# Patient Record
Sex: Female | Born: 1965 | Race: White | Hispanic: No | Marital: Single | Smoking: Current every day smoker
Health system: Southern US, Community
[De-identification: ages and names within clinical notes are randomized; demographics above are authoritative.]

## PROBLEM LIST (undated history)

## (undated) HISTORY — PX: ABDOMINAL HYSTERECTOMY: SHX81

---

## 2004-06-22 ENCOUNTER — Emergency Department: Payer: Self-pay | Admitting: Internal Medicine

## 2015-03-16 ENCOUNTER — Ambulatory Visit
Admission: RE | Admit: 2015-03-16 | Discharge: 2015-03-16 | Disposition: A | Discharge status: Home / Self Care | Payer: Self-pay | Source: Ambulatory Visit | Attending: Oncology | Admitting: Oncology

## 2015-03-16 ENCOUNTER — Ambulatory Visit: Payer: Self-pay | Attending: Oncology

## 2015-03-16 ENCOUNTER — Other Ambulatory Visit: Payer: Self-pay

## 2015-03-16 VITALS — BP 127/84 | HR 84 | Temp 97.9°F | Resp 18 | Ht 64.96 in | Wt 252.9 lb

## 2015-03-16 DIAGNOSIS — Z0000 Encounter for general adult medical examination without abnormal findings: Secondary | ICD-10-CM

## 2015-03-16 NOTE — Progress Notes (Signed)
Subjective:     Patient ID: Jane Cruz, female   DOB: 1965-07-20, 50 y.o.   MRN: [id number]  HPI   Review of Systems     Objective:   Physical Exam  Pulmonary/Chest: Right breast exhibits no inverted nipple, no mass, no nipple discharge, no skin change and no tenderness. Left breast exhibits no inverted nipple, no mass, no nipple discharge, no skin change and no tenderness. Breasts are symmetrical.       Assessment:     50 year old patient presents for [facility] clinic visit.  Patient screened, and meets BCCCP eligibility.  Patient does not have insurance, Medicare or Medicaid.  Handout given on Affordable Care Act.  Patient concerned over white discharge from montgomery gland right breast which she has been manipulating.  CBE unremarkable. Bilateral symmetrical fibroglandular tissue.  Instructed patient on breast self-exam using teach back method.       Plan:     Sent for bilateral screening mammogram.

## 2015-03-18 ENCOUNTER — Other Ambulatory Visit: Payer: Self-pay | Admitting: *Deleted

## 2015-03-18 DIAGNOSIS — N63 Unspecified lump in unspecified breast: Secondary | ICD-10-CM

## 2015-04-08 ENCOUNTER — Ambulatory Visit
Admission: RE | Admit: 2015-04-08 | Discharge: 2015-04-08 | Disposition: A | Discharge status: Home / Self Care | Payer: Self-pay | Source: Ambulatory Visit | Attending: Oncology | Admitting: Oncology

## 2015-04-08 DIAGNOSIS — N63 Unspecified lump in unspecified breast: Secondary | ICD-10-CM

## 2015-04-12 NOTE — Progress Notes (Signed)
Letter mailed from [facility] to notify of normal mammogram results.  Patient to return in one year for annual screening.  Copy to [facility]. 

## 2017-04-03 ENCOUNTER — Encounter: Payer: Self-pay | Admitting: Emergency Medicine

## 2017-04-03 ENCOUNTER — Ambulatory Visit
Admission: EM | Admit: 2017-04-03 | Discharge: 2017-04-03 | Disposition: A | Discharge status: Home / Self Care | Payer: Self-pay | Attending: Family Medicine | Admitting: Family Medicine

## 2017-04-03 ENCOUNTER — Other Ambulatory Visit: Payer: Self-pay

## 2017-04-03 DIAGNOSIS — H60502 Unspecified acute noninfective otitis externa, left ear: Secondary | ICD-10-CM

## 2017-04-03 MED ORDER — NEOMYCIN-POLYMYXIN-HC 3.5-10000-1 OT SUSP: 4.0000 [drp] | Freq: Three times a day (TID) | OTIC | 0 refills | Status: AC

## 2017-04-03 MED ORDER — CIPROFLOXACIN-DEXAMETHASONE 0.3-0.1 % OT SUSP: 4.0000 [drp] | Freq: Two times a day (BID) | OTIC | 0 refills | Status: AC

## 2017-04-03 NOTE — ED Provider Notes (Signed)
[facility]  CSN: [id number] Arrival date & time: 04/03/17  1305  History   Chief Complaint Chief Complaint  Patient presents with  . Otalgia   HPI  52 year old female presents with left ear pain.  Patient states that she is recently been sick.  She states that she has had a respiratory illness for the past 3 weeks.  She been using over-the-counter medication with improvement.  Approximately 2 days ago she developed severe left ear pain.  She has been taking leftover Keflex without resolution.  She is also used her daughter's old prescription for an ear drop without resolution.  She reports drainage from her left ear.  Pain is severe, 7/10 in severity.  No known exacerbating relieving factors.  No other reported symptoms.  No other complaints.  PMH:  No significant PMH.  Past Surgical History:  Procedure Laterality Date  . ABDOMINAL HYSTERECTOMY      OB History    No data available       Home Medications    Prior to Admission medications   Medication Sig Start Date End Date Taking? Authorizing Provider  ciprofloxacin-dexamethasone (CIPRODEX) OTIC suspension Place 4 drops into the left ear 2 (two) times daily for 5 days. 04/03/17 04/08/17  Tommie Samsook, Beaux Wedemeyer G, DO  ibuprofen (ADVIL,MOTRIN) 200 MG tablet Take 200 mg by mouth every 6 (six) hours as needed for headache or mild pain.    [provider]  neomycin-polymyxin-hydrocortisone (CORTISPORIN) 3.5-10000-1 OTIC suspension Place 4 drops into the left ear 3 (three) times daily for 5 days. 04/03/17 04/08/17  Tommie Samsook, Amita Atayde G, DO    Family History No reported family hx.  Social History Social History   Tobacco Use  . Smoking status: Current Every Day Smoker    Types: Cigarettes  . Smokeless tobacco: Never Used  Substance Use Topics  . Alcohol use: Yes    Frequency: Never  . Drug use: No    Allergies   Patient has no known allergies.   Review of Systems Review of Systems  Constitutional: Negative.     HENT: Positive for ear discharge and ear pain.    Physical Exam Triage Vital Signs ED Triage Vitals  Enc Vitals Group     BP 04/03/17 1411 125/84     Pulse Rate 04/03/17 1411 65     Resp 04/03/17 1411 16     Temp 04/03/17 1411 98.3 F (36.8 C)     Temp Source 04/03/17 1411 Oral     SpO2 04/03/17 1411 98 %     Weight 04/03/17 1409 240 lb (108.9 kg)     Height 04/03/17 1409 5\' 5"  (1.651 m)     Head Circumference --      Peak Flow --      Pain Score 04/03/17 1409 7     Pain Loc --      Pain Edu? --      Excl. in GC? --    Updated Vital Signs BP 125/84 (BP Location: Left Arm)   Pulse 65   Temp 98.3 F (36.8 C) (Oral)   Resp 16   Ht 5\' 5"  (1.651 m)   Wt 240 lb (108.9 kg)   SpO2 98%   BMI 39.94 kg/m     Physical Exam  Constitutional: She is oriented to person, place, and time. She appears well-developed. No distress.  HENT:  Head: Normocephalic and atraumatic.  Left ear with tragal tenderness.  Could not visualize TM due to edema the canal.  Drainage noted.  Cardiovascular: Normal rate and regular rhythm.  Pulmonary/Chest: Effort normal and breath sounds normal. She has no wheezes. She has no rales.  Neurological: She is alert and oriented to person, place, and time.  Psychiatric: She has a normal mood and affect. Her behavior is normal.  Nursing note and vitals reviewed.  UC Treatments / Results  Labs (all labs ordered are listed, but only abnormal results are displayed) Labs Reviewed - No data to display  EKG  EKG Interpretation None       Radiology No results found.  Procedures Procedures (including critical care time)  Medications Ordered in UC Medications - No data to display   Initial Impression / Assessment and Plan / UC Course  I have reviewed the triage vital signs and the nursing notes.  Pertinent labs & imaging results that were available during my care of the patient were reviewed by me and considered in my medical decision making (see  chart for details).     52 year old female presents with otitis externa. Treating with drops (patient to see which one she can afford).  Final Clinical Impressions(s) / UC Diagnoses   Final diagnoses:  Acute otitis externa of left ear, unspecified type    ED Discharge Orders        Ordered    neomycin-polymyxin-hydrocortisone (CORTISPORIN) 3.5-10000-1 OTIC suspension  3 times daily     04/03/17 1504    ciprofloxacin-dexamethasone (CIPRODEX) OTIC suspension  2 times daily     04/03/17 1504     Controlled Substance Prescriptions [place] Controlled Substance Registry consulted? Not Applicable   Tommie Sams, DO 04/03/17 1527

## 2017-04-03 NOTE — Discharge Instructions (Signed)
Use one of the antibiotics (depending on affordability).  Take care  Dr. Adriana Simasook

## 2017-04-03 NOTE — ED Triage Notes (Signed)
Patient has swelling and pain in and around her left ear that started 2 days ago.

## 2017-04-10 IMAGING — US US BREAST LTD UNI LEFT INC AXILLA
1 series · 13 of 25 positions shown · non-contrast
Comparison: Baseline screening mammogram dated 03/16/2015.

ACR Breast Density Category a: The breast tissue is almost entirely
fatty.

CLINICAL DATA: Patient returns today to evaluate left breast masses
identified on patient's recent baseline screening mammogram.

EXAM:
DIGITAL DIAGNOSTIC LEFT MAMMOGRAM WITH 3D TOMOSYNTHESIS
ULTRASOUND LEFT BREAST

[Series 1: us breast ltd uni left inc axilla · 0.05mm/px · 13 of 29 slices shown]
[im 1/29]
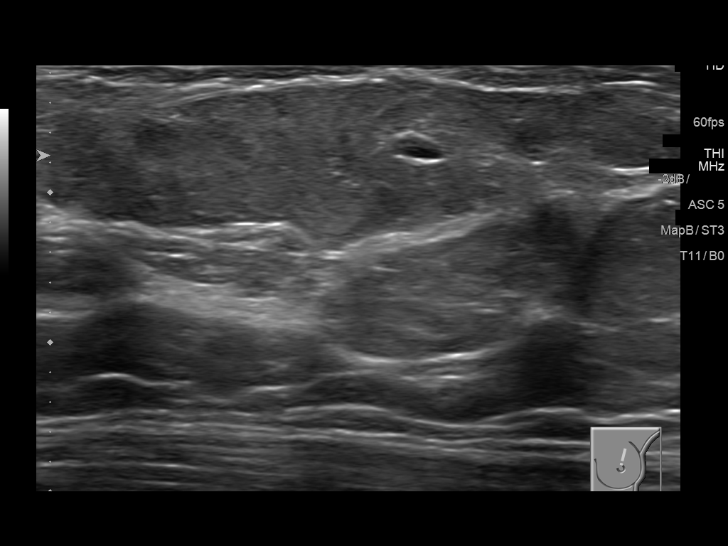
[im 3/29]
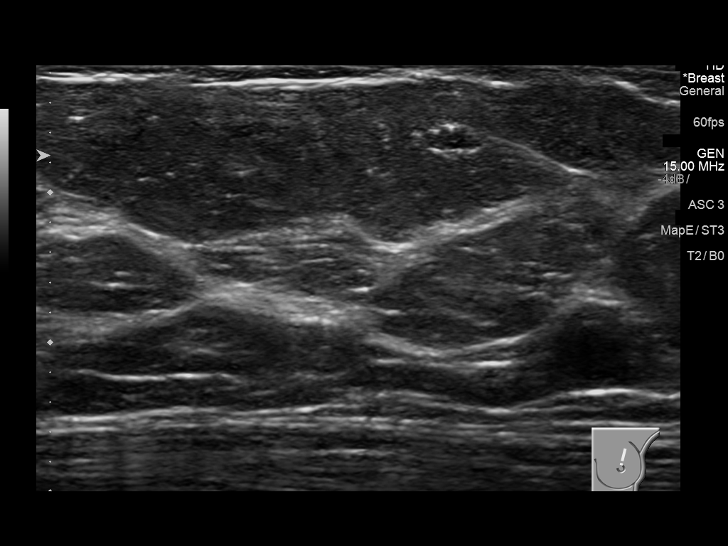
[im 5/29]
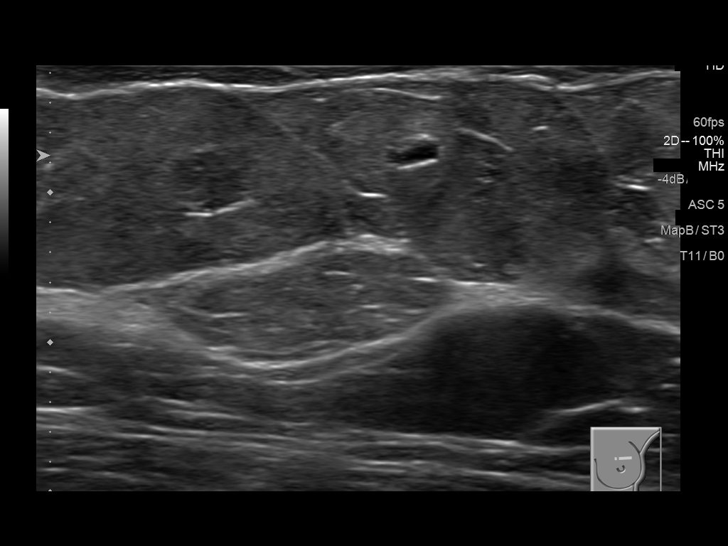
[im 8/29]
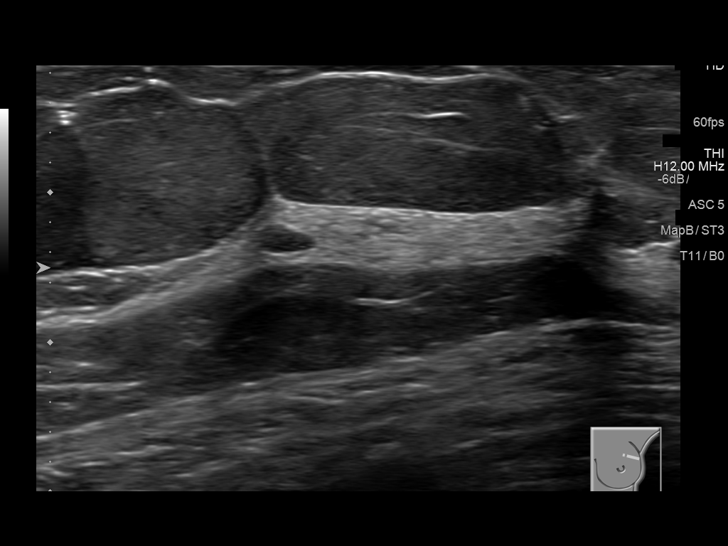
[im 10/29]
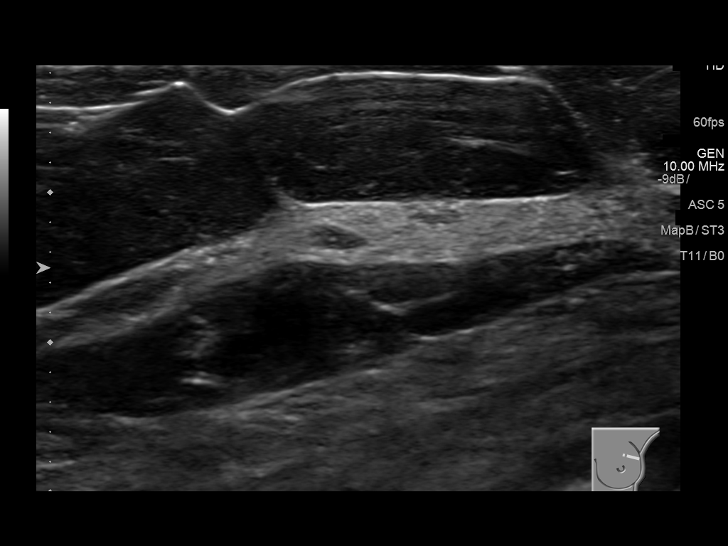
[im 12/29]
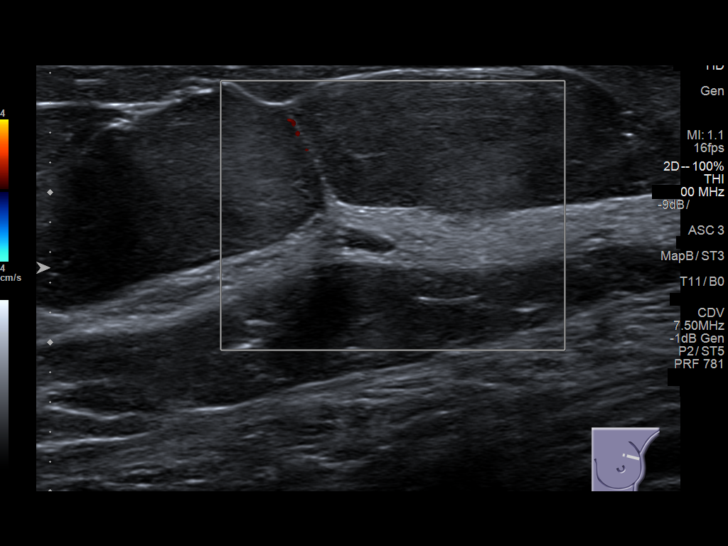
[im 15/29]
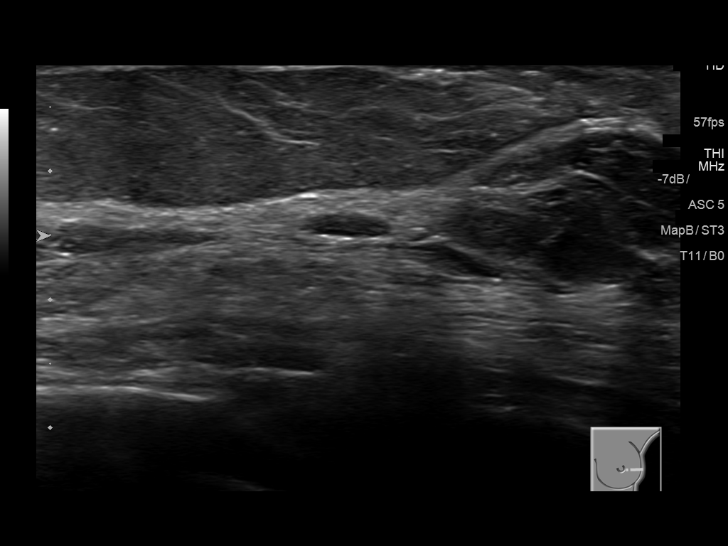
[im 17/29]
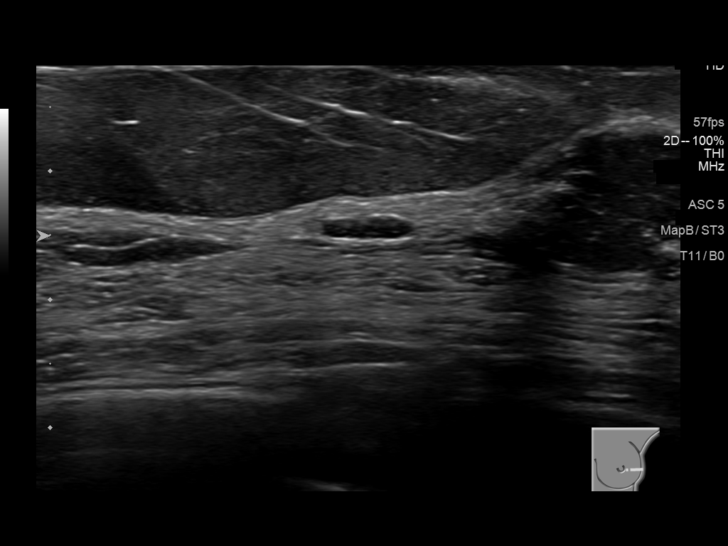
[im 19/29]
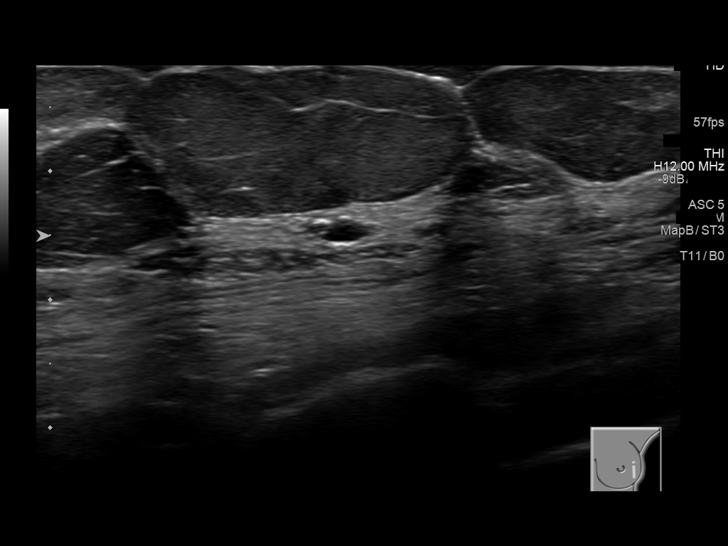
[im 22/29]
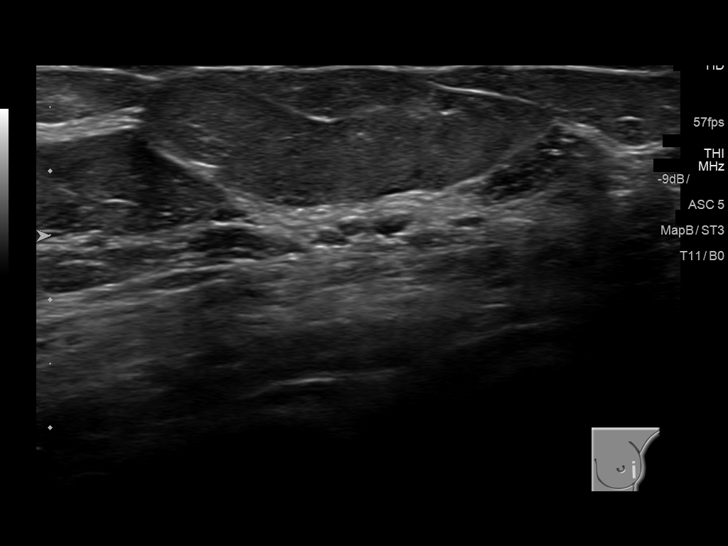
[im 24/29]
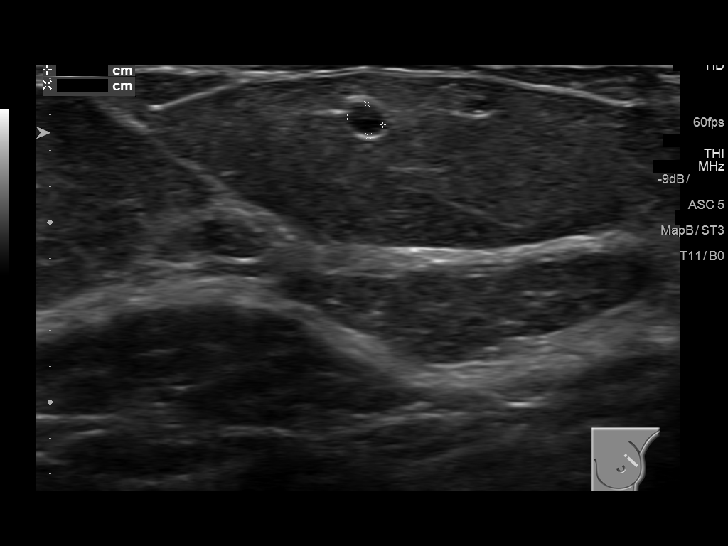
[im 26/29]
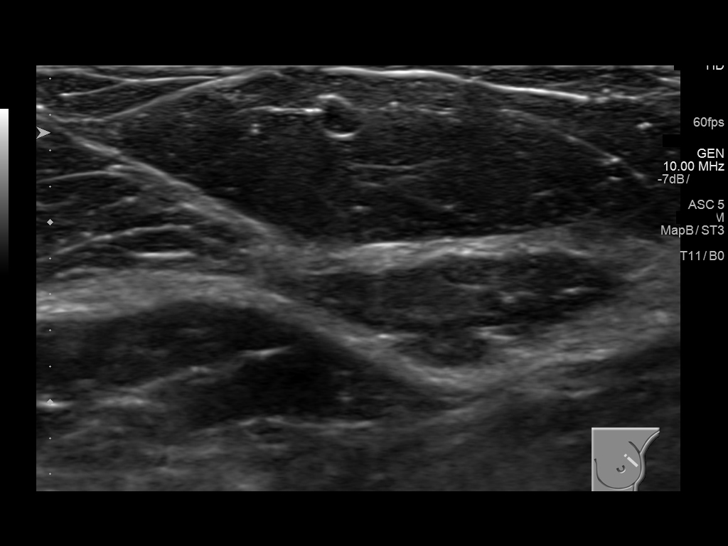
[im 29/29]
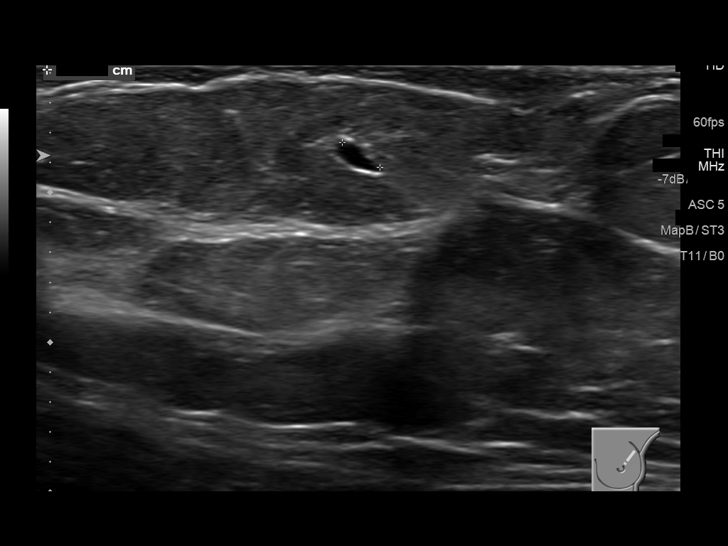

[13 of 25 positions shown; findings below may reference images not displayed]

FINDINGS: On today's additional views of the left breast with spot compression
and 3D tomosynthesis, there are multiple small circumscribed masses
scattered within the central and outer left breast. No dominant
masses, suspicious calcifications or other secondary signs of
malignancy.

Targeted ultrasound is performed, showing scattered benign cysts
throughout the upper outer quadrant of the left breast,
corresponding to the mammographic findings. Largest cyst measures 7
mm.
IMPRESSION: No evidence of malignancy. Multiple benign cysts within the left
breast.

Patient may return to routine annual bilateral screening mammogram
schedule.

RECOMMENDATION:
Screening mammogram in one year.(Code:Q5-2-6EN)

I have discussed the findings and recommendations with the patient.
Results were also provided in writing at the conclusion of the
visit. If applicable, a reminder letter will be sent to the patient
regarding the next appointment.

BI-RADS CATEGORY  2: Benign.
